# Patient Record
Sex: Female | Born: 1955 | Race: White | Hispanic: No | Smoking: Never smoker
Health system: Southern US, Community
[De-identification: ages and names within clinical notes are randomized; demographics above are authoritative.]

---

## 2008-12-08 ENCOUNTER — Encounter: Admission: RE | Admit: 2008-12-08 | Discharge: 2008-12-08 | Payer: Self-pay | Admitting: Internal Medicine

## 2009-01-18 ENCOUNTER — Ambulatory Visit (HOSPITAL_COMMUNITY): Admission: RE | Admit: 2009-01-18 | Discharge: 2009-01-18 | Payer: Self-pay | Admitting: Internal Medicine

## 2009-02-12 ENCOUNTER — Encounter: Admission: RE | Admit: 2009-02-12 | Discharge: 2009-02-12 | Payer: Self-pay | Admitting: Neurosurgery

## 2009-04-08 ENCOUNTER — Ambulatory Visit (HOSPITAL_COMMUNITY): Admission: RE | Admit: 2009-04-08 | Discharge: 2009-04-09 | Payer: Self-pay | Admitting: Orthopedic Surgery

## 2010-05-22 LAB — CBC
MCHC: 34.8 g/dL (ref 30.0–36.0)
MCV: 96.9 fL (ref 78.0–100.0)
Platelets: 217 10*3/uL (ref 150–400)
RBC: 4.16 MIL/uL (ref 3.87–5.11)
RDW: 12.2 % (ref 11.5–15.5)

## 2011-08-29 IMAGING — CR DG LUMBAR SPINE COMPLETE 4+V
5 series · 5 of 5 positions shown · non-contrast
Comparison: None

CLINICAL DATA: Persistent low back pain.

LUMBAR SPINE - COMPLETE 4+ VIEW

[view not recorded (1 of 5)]
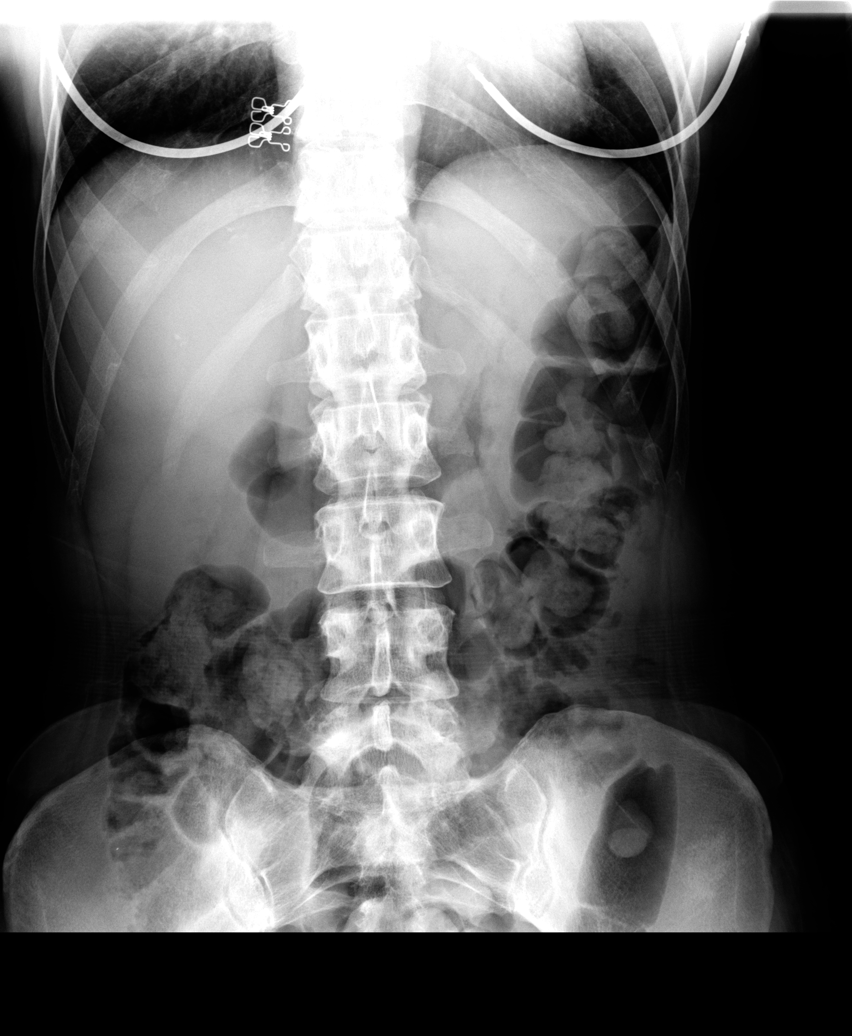

[view not recorded (2 of 5)]
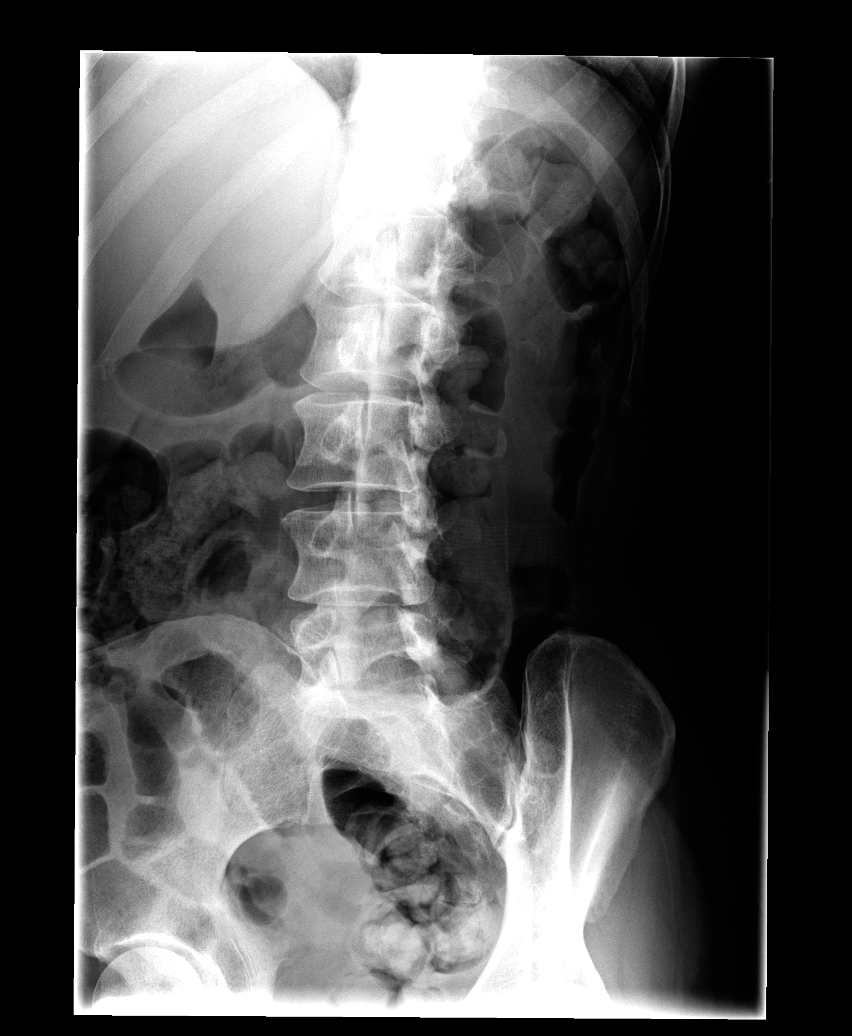

[view not recorded (3 of 5)]
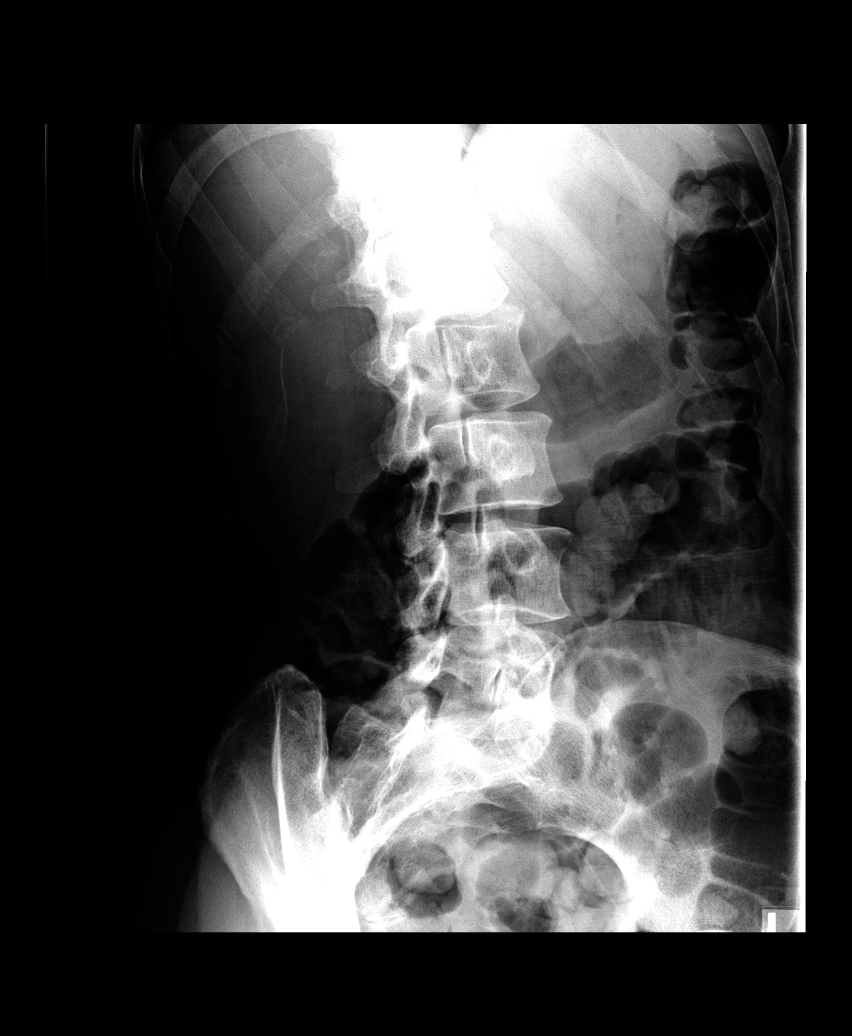

[view not recorded (4 of 5)]
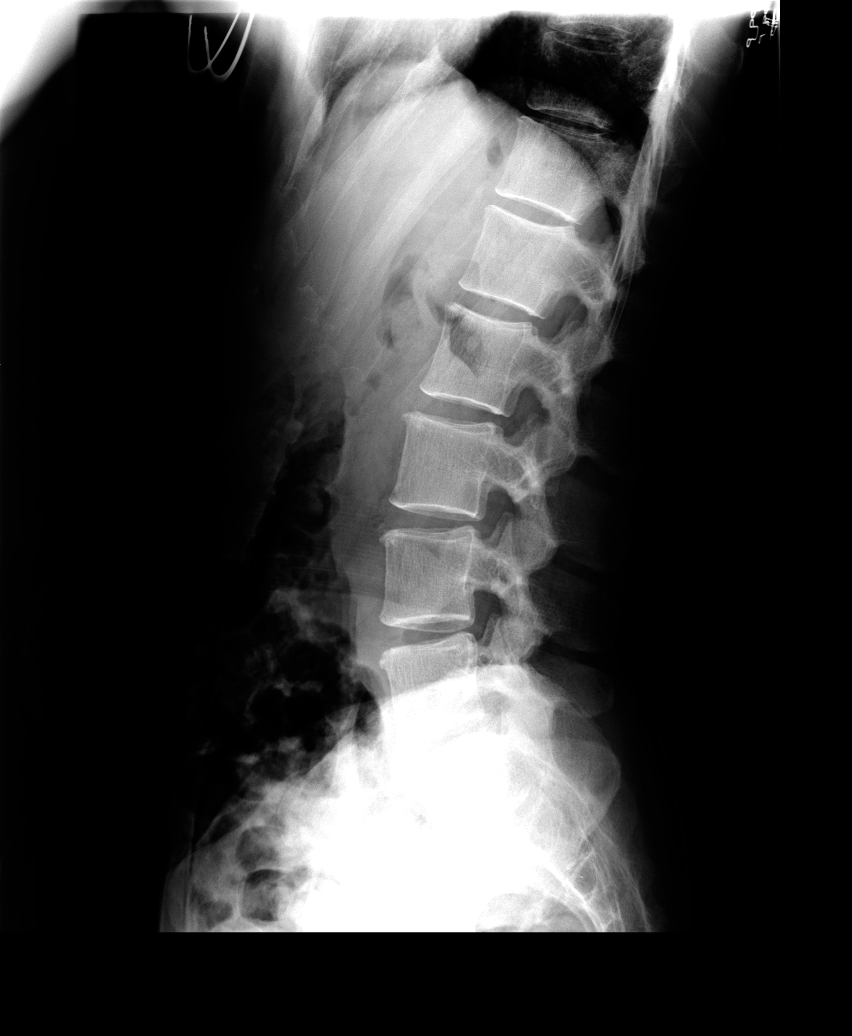

[view not recorded (5 of 5)]
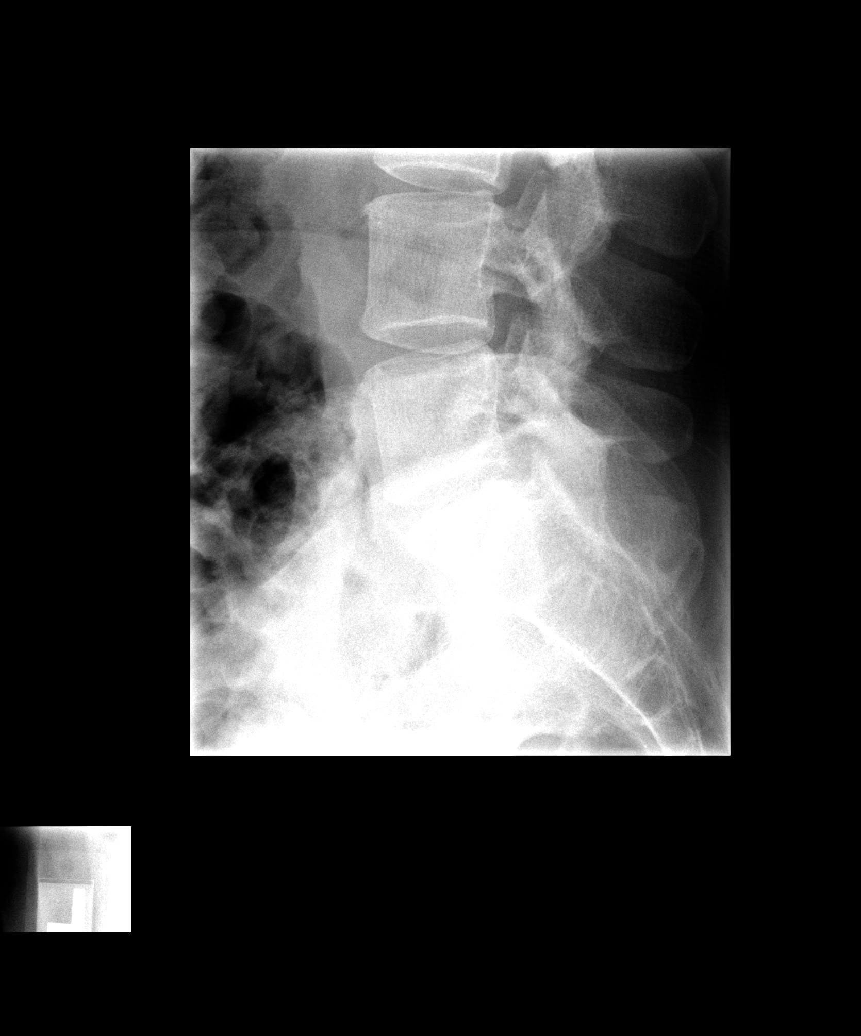

[5 of 5 positions shown; findings below may reference images not displayed]

FINDINGS: The lateral film demonstrates normal alignment.
Vertebral bodies and disc spaces are maintained.  No acute bony
findings.  Normal alignment of the facet joints and no pars
defects.  The visualized bony pelvis in intact.
IMPRESSION: Normal alignment and no acute bony findings.

## 2022-07-14 ENCOUNTER — Encounter: Payer: Self-pay | Admitting: Internal Medicine

## 2022-07-14 ENCOUNTER — Ambulatory Visit: Payer: Medicare HMO | Admitting: Internal Medicine

## 2022-07-14 VITALS — BP 116/78 | HR 100 | Temp 98.3°F | Resp 18 | Ht 64.25 in | Wt 107.1 lb

## 2022-07-14 DIAGNOSIS — M069 Rheumatoid arthritis, unspecified: Secondary | ICD-10-CM

## 2022-07-14 DIAGNOSIS — F419 Anxiety disorder, unspecified: Secondary | ICD-10-CM | POA: Insufficient documentation

## 2022-07-14 DIAGNOSIS — E538 Deficiency of other specified B group vitamins: Secondary | ICD-10-CM

## 2022-07-14 DIAGNOSIS — R5383 Other fatigue: Secondary | ICD-10-CM

## 2022-07-14 NOTE — Progress Notes (Unsigned)
   New Patient Office Visit  Subjective    Patient ID: Leslie Henry, female    DOB: 03-05-1956  Age: 67 y.o. MRN: [id number]  CC:  Chief Complaint  Patient presents with   New Patient (Initial Visit)    Feels weak and sick all the time.    HPI Leslie Henry presents to establish care ***  No outpatient encounter medications on file as of 07/14/2022.   No facility-administered encounter medications on file as of 07/14/2022.    No past medical history on file.   No family history on file.  Social History   Socioeconomic History   Marital status: Divorced    Spouse name: Not on file   Number of children: Not on file   Years of education: Not on file   Highest education level: Not on file  Occupational History   Not on file  Tobacco Use   Smoking status: Never   Smokeless tobacco: Never  Vaping Use   Vaping Use: Never used  Substance and Sexual Activity   Alcohol use: Never   Drug use: Never   Sexual activity: Not Currently  Other Topics Concern   Not on file  Social History Narrative   Not on file   Social Determinants of Health   Financial Resource Strain: Not on file  Food Insecurity: Not on file  Transportation Needs: Not on file  Physical Activity: Not on file  Stress: Not on file  Social Connections: Not on file  Intimate Partner Violence: Not on file    ROS      Objective    BP 116/78 (BP Location: Left Arm, Patient Position: Sitting, Cuff Size: Normal)   Pulse 100   Temp 98.3 F (36.8 C)   Resp 18   Ht 5' 4.25" (1.632 m)   Wt 107 lb 2 oz (48.6 kg)   SpO2 97%   BMI 18.25 kg/m   Physical Exam  {Labs (Optional):23779}    Assessment & Plan:   Problem List Items Addressed This Visit   None   No follow-ups on file.   Eloisa Northern, MD
